# Patient Record
Sex: Female | Born: 1940 | Race: Black or African American | Hispanic: No | Marital: Married
Health system: Southern US, Community
[De-identification: ages and names within clinical notes are randomized; demographics above are authoritative.]

---

## 2004-01-09 ENCOUNTER — Ambulatory Visit: Payer: Self-pay

## 2004-01-30 ENCOUNTER — Ambulatory Visit: Payer: Self-pay

## 2004-09-11 ENCOUNTER — Ambulatory Visit: Payer: Self-pay | Admitting: Obstetrics and Gynecology

## 2005-04-22 ENCOUNTER — Emergency Department: Payer: Self-pay | Admitting: General Practice

## 2005-04-22 ENCOUNTER — Other Ambulatory Visit: Payer: Self-pay

## 2005-08-19 ENCOUNTER — Ambulatory Visit: Payer: Self-pay | Admitting: Obstetrics and Gynecology

## 2005-09-30 ENCOUNTER — Ambulatory Visit: Payer: Self-pay | Admitting: Unknown Physician Specialty

## 2006-01-05 ENCOUNTER — Ambulatory Visit: Payer: Self-pay

## 2006-01-27 ENCOUNTER — Ambulatory Visit: Payer: Self-pay | Admitting: General Practice

## 2006-02-10 ENCOUNTER — Ambulatory Visit: Payer: Self-pay | Admitting: General Practice

## 2006-09-01 ENCOUNTER — Ambulatory Visit: Payer: Self-pay | Admitting: Obstetrics and Gynecology

## 2007-03-16 IMAGING — NM NUCLEAR MEDICINE WHOLE BODY BONE SCINTIGRAPHY
2 series · 10 of 10 positions shown · non-contrast
Comparison: none

REASON FOR EXAM: back pain     radicular symptoms
COMMENTS:

[Series 1: statics · 2.40mm/px · 4 acquisitions, 8 frames shown]
[im 1/4]
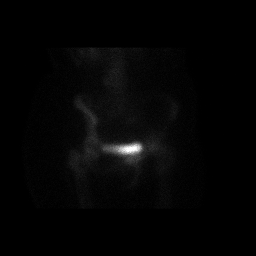
[im 1/4]
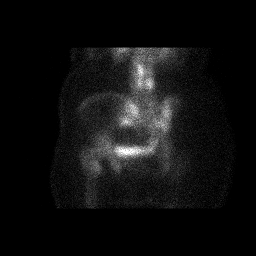
[im 2/4]
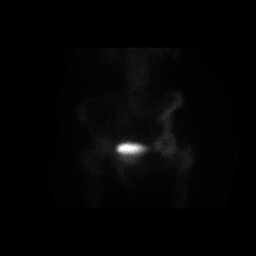
[im 2/4]
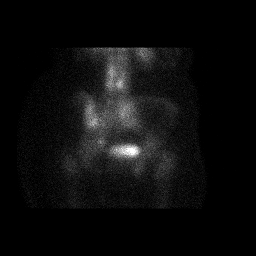
[im 3/4]
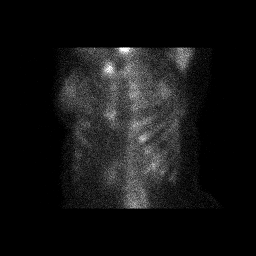
[im 3/4]
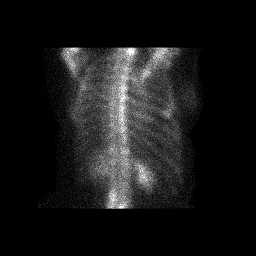
[im 4/4]
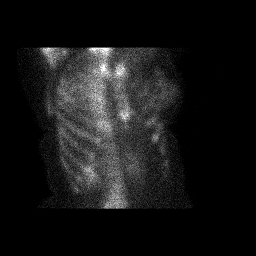
[im 4/4]
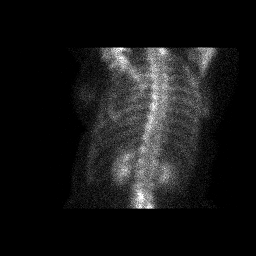

[Series 1: 3 hr wholebody · 2.40mm/px · 2 of 2 frames shown]
[frame 1/2]
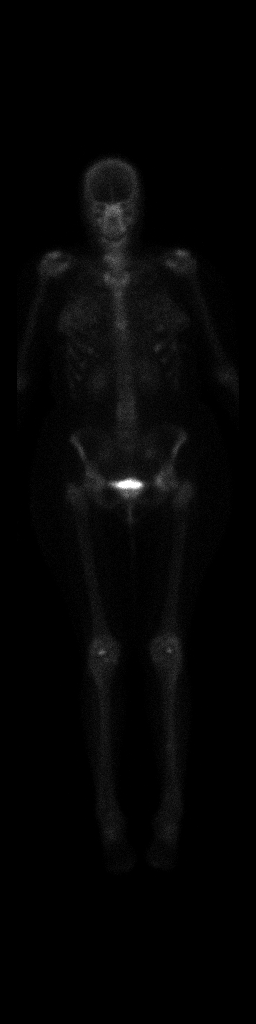
[frame 2/2]
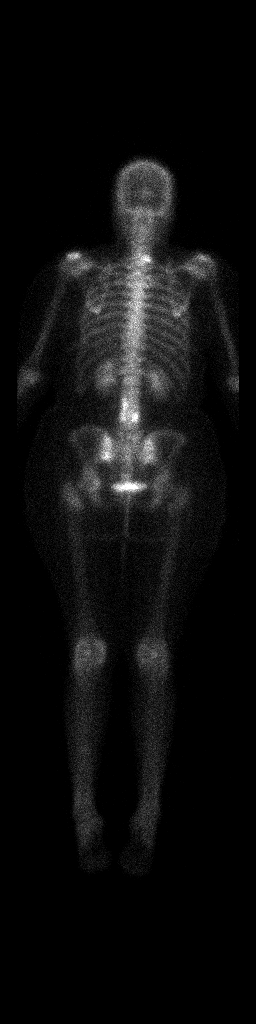

[10 of 10 positions shown; findings below may reference images not displayed]

PROCEDURE:     NM  - NM BONE WB 3 HR [DATE]  [DATE]

RESULT:     Following intravenous administration of 20.97 mCi of
technetium-44m MDP, total body bone scan was performed.  There is a mild
focal area of increased tracer activity in the region of the lower cervical
spine and lateralized to the RIGHT.  Degenerative or arthritic change would
be the primary consideration as to the etiology at this point.  There is
also noted a slight increase in tracer activity in the lumbar spine at L3
and L4.  These changes also are nonspecific, but degenerative change would
be the primary consideration. The distribution of tracer activity otherwise
is normal. Tracer activity is seen in both kidneys.
IMPRESSION: 1)No abnormal foci of increased tracer activity considered highly suspicious
for metastatic disease are identified. As mentioned above there are
nonspecific areas of increased tracer activity in the lower cervical spine
on the RIGHT and in the lower lumbar spine at approximately L3 and L4.
These foci are considered most likely to be on a degenerative or arthritic
basis.

## 2007-06-08 ENCOUNTER — Ambulatory Visit: Payer: Self-pay

## 2007-09-02 ENCOUNTER — Ambulatory Visit: Payer: Self-pay | Admitting: Obstetrics and Gynecology

## 2008-09-06 ENCOUNTER — Ambulatory Visit: Payer: Self-pay | Admitting: Obstetrics and Gynecology

## 2009-03-22 ENCOUNTER — Ambulatory Visit: Payer: Self-pay

## 2009-09-03 ENCOUNTER — Encounter: Admission: RE | Admit: 2009-09-03 | Discharge: 2009-09-03 | Payer: Self-pay | Admitting: Orthopedic Surgery

## 2009-10-04 ENCOUNTER — Ambulatory Visit: Payer: Self-pay | Admitting: Obstetrics and Gynecology

## 2010-10-17 ENCOUNTER — Ambulatory Visit: Payer: Self-pay | Admitting: Unknown Physician Specialty

## 2010-10-21 ENCOUNTER — Ambulatory Visit: Payer: Self-pay | Admitting: Obstetrics and Gynecology

## 2011-10-22 ENCOUNTER — Ambulatory Visit: Payer: Self-pay | Admitting: Obstetrics and Gynecology

## 2012-05-14 ENCOUNTER — Ambulatory Visit: Payer: Self-pay | Admitting: General Practice

## 2012-11-08 ENCOUNTER — Ambulatory Visit: Payer: Self-pay | Admitting: Obstetrics and Gynecology

## 2012-11-21 ENCOUNTER — Ambulatory Visit: Payer: Self-pay | Admitting: Family Medicine

## 2012-11-24 ENCOUNTER — Ambulatory Visit: Payer: Self-pay | Admitting: Gynecologic Oncology

## 2012-11-29 ENCOUNTER — Ambulatory Visit: Payer: Self-pay | Admitting: Gynecologic Oncology

## 2012-11-29 LAB — CBC CANCER CENTER
Basophil #: 0.1 x10 3/mm (ref 0.0–0.1)
Basophil %: 0.7 %
Eosinophil %: 0.7 %
HCT: 36.4 % (ref 35.0–47.0)
Monocyte #: 0.8 x10 3/mm (ref 0.2–0.9)
Monocyte %: 8.3 %
RBC: 4.36 10*6/uL (ref 3.80–5.20)

## 2012-11-29 LAB — COMPREHENSIVE METABOLIC PANEL
Albumin: 3 g/dL — ABNORMAL LOW (ref 3.4–5.0)
Alkaline Phosphatase: 98 U/L (ref 50–136)
Anion Gap: 10 (ref 7–16)
Calcium, Total: 8.7 mg/dL (ref 8.5–10.1)
Chloride: 101 mmol/L (ref 98–107)
Co2: 27 mmol/L (ref 21–32)
Creatinine: 0.71 mg/dL (ref 0.60–1.30)
EGFR (African American): >60
Osmolality: 274 (ref 275–301)
SGOT(AST): 17 U/L (ref 15–37)
SGPT (ALT): 14 U/L (ref 12–78)

## 2012-11-30 LAB — CA 125: CA 125: 316.9 U/mL — ABNORMAL HIGH (ref 0.0–34.0)

## 2012-12-02 ENCOUNTER — Inpatient Hospital Stay: Payer: Self-pay | Admitting: Surgery

## 2012-12-02 LAB — URINALYSIS, COMPLETE
Glucose,UR: NEGATIVE mg/dL (ref 0–75)
Specific Gravity: 1.016 (ref 1.003–1.030)
Squamous Epithelial: <1
WBC UR: 2 /HPF (ref 0–5)

## 2012-12-02 LAB — COMPREHENSIVE METABOLIC PANEL
Alkaline Phosphatase: 110 U/L (ref 50–136)
Anion Gap: 7 (ref 7–16)
BUN: 10 mg/dL (ref 7–18)
Bilirubin,Total: 0.4 mg/dL (ref 0.2–1.0)
Calcium, Total: 9.8 mg/dL (ref 8.5–10.1)
Chloride: 94 mmol/L — ABNORMAL LOW (ref 98–107)
Creatinine: 0.77 mg/dL (ref 0.60–1.30)
EGFR (Non-African Amer.): >60
Osmolality: 261 (ref 275–301)
Potassium: 3.2 mmol/L — ABNORMAL LOW (ref 3.5–5.1)
SGOT(AST): 22 U/L (ref 15–37)
Total Protein: 8.5 g/dL — ABNORMAL HIGH (ref 6.4–8.2)

## 2012-12-02 LAB — CBC
MCH: 27.5 pg (ref 26.0–34.0)
MCHC: 33.8 g/dL (ref 32.0–36.0)
MCV: 82 fL (ref 80–100)
Platelet: 448 10*3/uL — ABNORMAL HIGH (ref 150–440)
RBC: 4.51 10*6/uL (ref 3.80–5.20)

## 2012-12-02 LAB — LIPASE, BLOOD: Lipase: 74 U/L (ref 73–393)

## 2012-12-03 ENCOUNTER — Ambulatory Visit: Payer: Self-pay | Admitting: Gynecologic Oncology

## 2012-12-03 LAB — CBC WITH DIFFERENTIAL/PLATELET
Basophil #: 0.1 10*3/uL (ref 0.0–0.1)
Basophil %: 0.4 %
Eosinophil #: 0 10*3/uL (ref 0.0–0.7)
Eosinophil %: 0.3 %
Lymphocyte #: 1.9 10*3/uL (ref 1.0–3.6)
Lymphocyte %: 14 %
MCV: 82 fL (ref 80–100)
Monocyte #: 1.3 x10 3/mm — ABNORMAL HIGH (ref 0.2–0.9)
Monocyte %: 9.9 %
Neutrophil %: 75.4 %
RBC: 4.49 10*6/uL (ref 3.80–5.20)

## 2012-12-03 LAB — BASIC METABOLIC PANEL
Chloride: 100 mmol/L (ref 98–107)
Glucose: 117 mg/dL — ABNORMAL HIGH (ref 65–99)
Osmolality: 259 (ref 275–301)

## 2013-12-04 ENCOUNTER — Ambulatory Visit: Payer: Self-pay | Admitting: Unknown Physician Specialty

## 2013-12-11 ENCOUNTER — Ambulatory Visit: Payer: Self-pay | Admitting: Family Medicine

## 2014-02-02 ENCOUNTER — Emergency Department: Payer: Self-pay | Admitting: Emergency Medicine

## 2014-02-02 LAB — CBC
HCT: 35.7 % (ref 35.0–47.0)
HGB: 11.7 g/dL — AB (ref 12.0–16.0)
MCH: 30.4 pg (ref 26.0–34.0)
MCHC: 32.8 g/dL (ref 32.0–36.0)
MCV: 93 fL (ref 80–100)
PLATELETS: 255 10*3/uL (ref 150–440)
RBC: 3.85 10*6/uL (ref 3.80–5.20)
RDW: 14.3 % (ref 11.5–14.5)
WBC: 4.1 10*3/uL (ref 3.6–11.0)

## 2014-02-02 LAB — COMPREHENSIVE METABOLIC PANEL
ALK PHOS: 70 U/L
ALT: 51 U/L
ANION GAP: 6 — AB (ref 7–16)
Albumin: 3.2 g/dL — ABNORMAL LOW (ref 3.4–5.0)
BILIRUBIN TOTAL: 0.4 mg/dL (ref 0.2–1.0)
BUN: 10 mg/dL (ref 7–18)
CHLORIDE: 97 mmol/L — AB (ref 98–107)
CREATININE: 0.64 mg/dL (ref 0.60–1.30)
Calcium, Total: 8.9 mg/dL (ref 8.5–10.1)
Co2: 29 mmol/L (ref 21–32)
EGFR (African American): >60
EGFR (Non-African Amer.): >60
Glucose: 105 mg/dL — ABNORMAL HIGH (ref 65–99)
OSMOLALITY: 264 (ref 275–301)
Potassium: 3.4 mmol/L — ABNORMAL LOW (ref 3.5–5.1)
SGOT(AST): 30 U/L (ref 15–37)
SODIUM: 132 mmol/L — AB (ref 136–145)
TOTAL PROTEIN: 7.5 g/dL (ref 6.4–8.2)

## 2014-02-02 LAB — LIPASE, BLOOD: Lipase: 63 U/L — ABNORMAL LOW (ref 73–393)

## 2014-02-03 LAB — CBC WITH DIFFERENTIAL/PLATELET
BASOS ABS: 0 10*3/uL (ref 0.0–0.1)
Basophil %: 0.2 %
EOS ABS: 0 10*3/uL (ref 0.0–0.7)
Eosinophil %: 0.4 %
HCT: 39.8 % (ref 35.0–47.0)
HGB: 13.4 g/dL (ref 12.0–16.0)
LYMPHS PCT: 8.5 %
Lymphocyte #: 0.4 10*3/uL — ABNORMAL LOW (ref 1.0–3.6)
MCH: 31 pg (ref 26.0–34.0)
MCHC: 33.7 g/dL (ref 32.0–36.0)
MCV: 92 fL (ref 80–100)
MONOS PCT: 7.3 %
Monocyte #: 0.4 x10 3/mm (ref 0.2–0.9)
NEUTROS ABS: 4 10*3/uL (ref 1.4–6.5)
Neutrophil %: 83.6 %
PLATELETS: 291 10*3/uL (ref 150–440)
RBC: 4.33 10*6/uL (ref 3.80–5.20)
RDW: 14.4 % (ref 11.5–14.5)
WBC: 4.8 10*3/uL (ref 3.6–11.0)

## 2014-02-03 LAB — COMPREHENSIVE METABOLIC PANEL
ALK PHOS: 81 U/L
ALT: 46 U/L
AST: 26 U/L (ref 15–37)
Albumin: 3.5 g/dL (ref 3.4–5.0)
Anion Gap: 13 (ref 7–16)
BILIRUBIN TOTAL: 0.5 mg/dL (ref 0.2–1.0)
BUN: 16 mg/dL (ref 7–18)
CALCIUM: 10.2 mg/dL — AB (ref 8.5–10.1)
CHLORIDE: 93 mmol/L — AB (ref 98–107)
CO2: 27 mmol/L (ref 21–32)
CREATININE: 0.73 mg/dL (ref 0.60–1.30)
Glucose: 111 mg/dL — ABNORMAL HIGH (ref 65–99)
Osmolality: 268 (ref 275–301)
Potassium: 3.7 mmol/L (ref 3.5–5.1)
Sodium: 133 mmol/L — ABNORMAL LOW (ref 136–145)
Total Protein: 8.2 g/dL (ref 6.4–8.2)

## 2014-02-03 LAB — LIPASE, BLOOD: Lipase: 57 U/L — ABNORMAL LOW (ref 73–393)

## 2014-02-04 ENCOUNTER — Inpatient Hospital Stay: Payer: Self-pay | Admitting: Surgery

## 2014-02-04 LAB — CBC WITH DIFFERENTIAL/PLATELET
BASOS PCT: 0.4 %
Basophil #: 0 10*3/uL (ref 0.0–0.1)
EOS ABS: 0.1 10*3/uL (ref 0.0–0.7)
Eosinophil %: 1.2 %
HCT: 35.9 % (ref 35.0–47.0)
HGB: 11.8 g/dL — AB (ref 12.0–16.0)
LYMPHS ABS: 1 10*3/uL (ref 1.0–3.6)
LYMPHS PCT: 23 %
MCH: 30.3 pg (ref 26.0–34.0)
MCHC: 32.9 g/dL (ref 32.0–36.0)
MCV: 92 fL (ref 80–100)
MONO ABS: 0.6 x10 3/mm (ref 0.2–0.9)
Monocyte %: 13.8 %
NEUTROS ABS: 2.7 10*3/uL (ref 1.4–6.5)
Neutrophil %: 61.6 %
Platelet: 252 10*3/uL (ref 150–440)
RBC: 3.89 10*6/uL (ref 3.80–5.20)
RDW: 14.5 % (ref 11.5–14.5)
WBC: 4.4 10*3/uL (ref 3.6–11.0)

## 2014-02-04 LAB — URINALYSIS, COMPLETE
BACTERIA: NONE SEEN
Bilirubin,UR: NEGATIVE
Blood: NEGATIVE
GLUCOSE, UR: NEGATIVE mg/dL (ref 0–75)
Leukocyte Esterase: NEGATIVE
Nitrite: NEGATIVE
Ph: 5 (ref 4.5–8.0)
RBC,UR: 2 /HPF (ref 0–5)
SPECIFIC GRAVITY: 1.031 (ref 1.003–1.030)
Squamous Epithelial: <1

## 2014-02-04 LAB — POTASSIUM: Potassium: 3.6 mmol/L (ref 3.5–5.1)

## 2014-02-04 LAB — CREATININE, SERUM: Creatinine: 0.68 mg/dL (ref 0.60–1.30)

## 2014-05-25 NOTE — H&P (Signed)
PATIENT NAME:  Dawn Chapman, Dawn Chapman MR#:  [id number] DATE OF BIRTH:  05/18/1940  DATE OF ADMISSION:  12/02/2012  PRIMARY CARE PHYSICIAN:  Barry Brunner, M.D.   ONCOLOGIST:  Dr. Comer Locket.   ADMITTING PHYSICIAN:  Quentin Ore, III, M.D.   CHIEF COMPLAINT: Abdominal pain, nausea, vomiting.   BRIEF HISTORY: Dawn Chapman is a 74 year old woman seen in the Emergency Room with an abrupt onset of nausea and vomiting. She has had a several week illness with intermittent abdominal pain and reflux symptoms, occasional nausea and had been evaluated by her primary care physician. CT scan was performed earlier in the month of October which demonstrated ascites, small bowel distention, ovarian cystic disease and possible omental metastatic disease. A CA-125 was performed which was elevated and she is being worked up for possible ovarian malignancy. Earlier in the week, she saw Dr. Wendy Poet in the Cancer Center. Because of her abdominal distention and small bowel distention, she was set up for colonoscopy today. She took the colonoscopy prep last evening and developed significant nausea, vomiting and increasing abdominal pain. She called the oncology office and was referred to the Emergency Room for further evaluation.   In the Emergency Room, laboratory values revealed a sodium of 130, potassium of 3.2,  chloride of 94, white blood cell count was 13,900, hemoglobin was 12.4, platelet count was 448,000. CA-125 done on the 28th was 317. CT scan without p.o. contrast was performed. There were multiple loops of distended small bowel with decompressed distal bowel. There appeared to be some significant amount of stool in the colon. There was questionable ovarian mass on the left side. There seemed to be significant thickening of the omentum and there was a large amount of ascites. The surgical service was consulted because of the potential for bowel obstruction.   She denies a history of other GI  problems, specifically hepatitis, yellow jaundice, pancreatitis, peptic ulcer disease, gallbladder disease or diverticulitis. Her only previous surgeries have been an  abdominal hysterectomy and a partial thyroidectomy. She is regularly followed by Dr. Barry Brunner at the [facility]. She has no major medical problems. She has a questionable diagnosis of Parkinson's disease on the chart, although she denies it. Specifically, she denies any cardiac disease, hypertension or diabetes.   CURRENT MEDICATIONS: Include amitriptyline 25 mg p.o. daily, estradiol 0.5 mg once a day, oxybutynin 5 mg 3 times a day and pramipexole 1 mg b.i.d.   ALLERGIES:  She has no medical allergies.   SOCIAL HISTORY: She is not a cigarette smoker and does not drink any alcohol.   REVIEW OF SYSTEMS:  Otherwise negative, with the exception of the symptoms noted above the HPI.   FAMILY HISTORY: Noncontributory.   PHYSICAL EXAMINATION: GENERAL: She is a thin, uncomfortable woman in moderate distress from pain.  VITALS: Blood pressure is 148/77, heart rate is 108. Pain scale is a 10. Temperature is normal.  HEENT: No scleral icterus. No pupillary abnormalities.  NECK: Supple, nontender with a midline trachea. No adenopathy.  CHEST: Clear with no adventitious sounds. She has normal pulmonary excursion.  CARDIAC: No murmurs or gallops. She seems to be in normal sinus rhythm.  ABDOMEN: Her abdomen is tense, moderately distended and firm. She has no rebound or guarding. She has some mild periumbilical and right lower quadrant tenderness. There is a palpable mass in her umbilicus. She has some thickening in both groins consistent with possible adenopathy.  EXTREMITIES: Lower extremity exam reveals full range of motion,  no deformities. Good distal pulses. No edema.  PSYCHIATRIC: Some mild anxiety. No depression, normal orientation.   IMAGING:  I had difficulty reviewing her CT scan, but after looking at it on the  CT monitor in the scanner, it does appear to have significant abdominal distention. There is no p.o. contrast to be able to identify the loops of bowel very well. There is some distal decompressed bowel though obviously noted. She does have a what appears to be omental caking and she does have moderate ascites.   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 She will be admitted for decompression and further evaluation by the oncology service. This plan has been discussed with the patient and her family and they are in agreement.    ____________________________ Carmie Endalph L. Ely III, MD rle:NTS D: 12/02/2012 05:21:53 ET T: 12/02/2012 05:44:23 ET JOB#: [id number]  cc: Quentin Orealph L. Ely III, MD, <Dictator> Quentin OreALPH L ELY MD ELECTRONICALLY SIGNED 12/05/2012 9:58

## 2014-05-25 NOTE — Discharge Summary (Signed)
PATIENT NAME:  Dawn DiegoYARBOROUGH, Khamila MR#:  [id number] DATE OF BIRTH:  1940/04/25  DATE OF ADMISSION:  12/02/2012 DATE OF TRANSFER:  12/03/2012   DIAGNOSES: Abdominal pain, small bowel obstruction, probable ovarian malignancy.   PROCEDURES: None.   CONSULTANTS: Knute Neuobert G. Gittin, MD  HISTORY OF PRESENT ILLNESS AND HOSPITAL COURSE: This is a patient with signs of an ovarian malignancy. She was to have a colonoscopy and started taking the bowel prep and started having nausea, vomiting and abdominal pain. She was seen in the Emergency Room by my partner, Dr. Michela PitcherEly, who admitted the patient to the hospital, where a CT scan suggested small bowel obstruction with ovarian malignancy on the left side, with adnexal mass and ascites and omental caking.   The patient had previously seen Dr. Doylene Canninghoksi and Dr. Wendy PoetBrigitte Miller. At this juncture today, the family members have requested [facility] mostly to expedite the workup and to involve GYN oncology. The patient will likely require exploration and relief of a bowel obstruction as well as diagnostic studies (biopsy) and possible debulking, which is not available here on the weekend. The family is requesting transfer at this point and has initiated this process with me, and I have spoken to the accepting physician, Dr. Reyne DumasVan Le, whose fellow, Dr. Meredith ModyStein, will be accepting the patient today once a bed becomes available. Transfer will be by ground EMS, with the patient having a nasogastric tube and an IV running. Questions have been answered for Dr. Reyne DumasVan Le, who I spoke to directly by telephone. Transfer summary is being dictated STAT. Copies of CT scans done yesterday and 2 weeks ago are being provided by CD/DVD, as well as any pertinent lab and documentation. Family is in agreement with this plan.    ____________________________ Adah Salvageichard E. Excell Seltzerooper, MD rec:lb D: 12/03/2012 13:01:55 ET T: 12/03/2012 13:10:48 ET JOB#: [id number]  cc: Adah Salvageichard E. Excell Seltzerooper, MD,  <Dictator> Lattie HawICHARD E Sequoyah Ramone MD ELECTRONICALLY SIGNED 12/04/2012 7:15

## 2014-05-28 LAB — SURGICAL PATHOLOGY

## 2014-05-30 NOTE — Consult Note (Signed)
PATIENT NAME:  Dawn Chapman, [name] MR#:  [id number] DATE OF BIRTH:  20-Apr-1940  DATE OF CONSULTATION:  02/04/2014  REFERRING PHYSICIAN:   CONSULTING PHYSICIAN:  Knute Neuobert G. Lorre NickGittin, MD  Dawn Chapman is a 74 year old patient who was admitted today, history as in the surgical note, 2 weeks of increased abdominal pain, nausea, and vomiting. She has ovarian cancer documented in November 2014. She was seen by [facility] Wendy [facility]Brigitte Miller [facility], but [facility] to [facility] and as per the patient's history, she had chemotherapy through April 2015, had surgery in April 2015, was off of treatment for a few months. Sometime this summer, she started on additional chemotherapy. Also, the patient relates that her last treatment, which was Tuesday, December 29, represented her changing to new medication. However, she does not recall being told that her cancer was resistant or progressive. The treatments were intravenous. The patient was not sure if her next treatment was supposed to be weekly, every 2 weeks, or every 3 weeks. Her family members, who have her records in her folder, may shed light on her schedule, and also records are being obtained [facility].   On this background, the patient who had been put on Carafate and PPIs for nausea and reflux, was found clinically and on plain x-ray, and now on CT scan to have a small bowel obstruction with  transition point in the right lower quadrant. The patient has not had a bowel movement in over 4 days, but relates that she has passed some flatus. Since admission, she is on an NG tube and is more comfortable. She has abdominal fullness and feels pressure. Does not complain at this time of abdominal pain. She denied any fever, chills, sweats. No cough, wheezing, or hemoptysis. She has reported some recent weight loss. No rash, bruising, or pruritus. No focal weakness. No edema. No bleeding or bruising. The patient reports that she is not familiar with and has not  received growth factors, either Neupogen or Neulasta support with her chemotherapies prior. She reports that she has not had transfusions or problems with low blood counts.   PHYSICAL EXAMINATION: GENERAL: Alert, cooperative, in no acute distress. Nasogastric tube is in place.  SCLERAE: No jaundice.  MOUTH: No thrush.  LUNGS: Clear. LYMPHATICS: No palpable lymph nodes in the neck, supraclavicular, submandibular, or axilla.  HEART: Regular.  ABDOMEN: Not tender. No guarding, no rigidity. Some ankle and pretibial mild symmetric edema.  NEUROLOGIC: Grossly nonfocal.  PSYCHIATRIC: Mood and affect unremarkable.   IMAGING: Plain x-ray showed evidence of partial small bowel obstruction. CT scan with IV no p.o. contrast shows same. There is fatty liver. Notably, there is nothing on our CT report that indicates residual disease so that the reasons behind and evaluations behind her changing to a new medication, if this history is correct, are unclear at this time. We do not have a tumor marker.   SOCIAL HISTORY: Reviewed. No alcohol, no tobacco.   FAMILY HISTORY: Noncontributory to current acute illness.   LABORATORY DATA: The lipase was 57. January 2 labs: A creatinine of 0.73, potassium 3.7. Liver functions were normal. The CBC showed a white count 4.8, hemoglobin 13.4, hematocrit 39.8, platelets 291,000. Neutrophils were 4000.   IMPRESSION: Small bowel obstruction. CT of abdomen and pelvis with IV contrast done. No evidence of intra-abdominal or pelvic cancer. Minimal right pleural effusion, etiology unclear. Could potentially be malignant or spurious. It is unclear if has had prior evaluation for treatment for intrathoracic disease.  Reportedly, she is day 6 of a chemotherapy treatment given on December 29. There is currently no cytopenia from yesterday's results.   PLAN: Nothing acutely to offer from Oncology. Would make sure we document a CBC every day watching for cytopenia. Would document a  CA-125. Would need to get the full records from [facility]. At this point, it looks like the patient and family are requesting transfer back to [facility] for surgical and medical oncology followup.    ____________________________ Knute Neu. Lorre Nick, MD rgg:sw D: 02/04/2014 11:15:44 ET T: 02/04/2014 12:55:52 ET JOB#: [id number]  cc: Knute Neu. Lorre Nick, MD, <Dictator> Marin Roberts MD ELECTRONICALLY SIGNED 02/14/2014 1:25

## 2014-05-30 NOTE — H&P (Signed)
PATIENT NAME:  Dawn Chapman, Dawn Chapman MR#:  [id number] DATE OF BIRTH:  [birth year]  DATE OF ADMISSION:  02/04/2014  [facility] [place].  ONCOLOG[facility]: [facility].   ADMITTING PHYSICIAN: Quentin Orealph L. Ely, III, M.D.    CHIEF COMPLAINT: Abdominal pain, nausea and vomiting.   HISTORY OF PRESENT ILLNESS:  Dawn Chapman is a 74 year old woman seen in the Emergency Room with a 2-week history of increasing abdominal pain, nausea and vomiting. She has a known left ovarian cancer identified in November 2014, when she was admitted with similar symptoms. She was seen by the cancer center and Dr. Wendy PoetBrigitte Miller, at that time. Her family [facility] after the patient was admitted to the hospital here. She allegedly underwent a partial hysterectomy, omentectomy, and port placement. She was begun on chemotherapy and is currently still taking chemotherapy. She contacted her primary care doctor approximately 10 days ago with complaints of abdominal pain, nausea, no vomiting, but some significant reflux symptoms. She was started on Carafate and PPI drugs. She underwent chemotherapy this past Tuesday on December 29. Her oncologist added some antiemetics at that time. She continued to have increasing abdominal pain and began to vomit profusely yesterday. She presented to the Emergency Room this evening. Electrolytes were largely unremarkable. She did not have a significant elevation in white blood cell count. Plain films of the abdomen demonstrated stacking of dilated loops of small intestine with minimal gas in the colon. The surgical service was consulted for recurrent small bowel obstruction. She has not had a recent CT scan.   MEDICATIONS: She does not take any current medicines other than Carafate and omeprazole.   ALLERGIES: She is not allergic to any medications.   SOCIAL HISTORY: She is not a cigarette smoker and does not drink any alcohol.   REVIEW OF SYSTEMS:  Unremarkable with the exception of symptoms above, with the one exception that she has been losing weight recently. She denies any chest pain, shortness of breath or urinary symptoms.   FAMILY HISTORY: Noncontributory.   PHYSICAL EXAMINATION:  GENERAL: She is lying uncomfortably in bed with a nasogastric tube in place.  VITAL SIGNS: Blood pressure 140/80. She is afebrile. Heart rate is 96 and regular.  HEENT: Reveals facial wasting, but no scleral icterus. No pupillary abnormalities.  NECK: Supple, nontender, with midline trachea.  CHEST: Clear with very distant breath sounds with no adventitious sounds and normal pulmonary excursion.  CARDIAC: No murmurs or gallops to my ear. She seems to be in normal sinus rhythm.  ABDOMEN: Reveals a well-healed midline scar with some mild left lower quadrant pain. I cannot feel a mass in that area. She has no guarding or rebound, hypoactive bowel sounds. No hernias noted.  EXTREMITIES: Reveals some mild edema. Full range of motion and normal distal pulses.  PSYCHIATRIC: Normal orientation, normal affect.   IMPRESSION: I have independently reviewed her plain films. She does appear to have a partial small bowel obstruction likely related to her known ovarian cancer.  We are at a significant disadvantage since I do not have any of the records or recent treatment plan. I have not had any imaging to determine the extent of her current disease. She does want to be admitted to the hospital here rather than [facility].   We will admit her to the hospital, attempt to obtain her records, put her on nasogastric decompression and rehydration. I recommend a CT scan and an oncology evaluation. She is  in agreement with this plan.    ____________________________ Carmie End, MD rle:JT D: 02/04/2014 06:15:57 ET T: 02/04/2014 08:54:09 ET JOB#: [id number]  cc: Carmie End, MD, <Dictator> [facility] Quentin Ore  MD ELECTRONICALLY SIGNED 02/04/2014 20:52

## 2014-06-03 DEATH — deceased
